# Patient Record
Sex: Male | Born: 1950
Health system: Southern US, Community
[De-identification: ages and names within clinical notes are randomized; demographics above are authoritative.]

## PROBLEM LIST (undated history)

## (undated) DIAGNOSIS — I10 Essential (primary) hypertension: Secondary | ICD-10-CM

## (undated) DIAGNOSIS — E7800 Pure hypercholesterolemia, unspecified: Secondary | ICD-10-CM

## (undated) DIAGNOSIS — C4491 Basal cell carcinoma of skin, unspecified: Secondary | ICD-10-CM

## (undated) DIAGNOSIS — L309 Dermatitis, unspecified: Secondary | ICD-10-CM

## (undated) HISTORY — DX: Pure hypercholesterolemia, unspecified: E78.00

## (undated) HISTORY — PX: VASECTOMY: SHX75

## (undated) HISTORY — DX: Essential (primary) hypertension: I10

## (undated) HISTORY — DX: Dermatitis, unspecified: L30.9

---
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
## 1996-07-10 HISTORY — PX: APPENDECTOMY: SHX54

## 2005-09-12 ENCOUNTER — Emergency Department (HOSPITAL_COMMUNITY): Admission: EM | Admit: 2005-09-12 | Discharge: 2005-09-12 | Payer: Self-pay | Admitting: Emergency Medicine

## 2007-05-17 ENCOUNTER — Emergency Department (HOSPITAL_COMMUNITY): Admission: EM | Admit: 2007-05-17 | Discharge: 2007-05-17 | Payer: Self-pay | Admitting: Emergency Medicine

## 2009-04-15 DIAGNOSIS — C4491 Basal cell carcinoma of skin, unspecified: Secondary | ICD-10-CM

## 2009-04-15 HISTORY — DX: Basal cell carcinoma of skin, unspecified: C44.91

## 2010-04-01 ENCOUNTER — Encounter: Admission: RE | Admit: 2010-04-01 | Discharge: 2010-04-01 | Payer: Self-pay | Admitting: Family Medicine

## 2010-08-23 ENCOUNTER — Encounter: Payer: Self-pay | Admitting: Internal Medicine

## 2010-08-26 ENCOUNTER — Encounter: Payer: Self-pay | Admitting: Internal Medicine

## 2010-08-29 ENCOUNTER — Encounter: Payer: Self-pay | Admitting: Internal Medicine

## 2010-09-13 ENCOUNTER — Encounter: Payer: Self-pay | Admitting: Internal Medicine

## 2010-09-13 DIAGNOSIS — R0989 Other specified symptoms and signs involving the circulatory and respiratory systems: Secondary | ICD-10-CM | POA: Insufficient documentation

## 2010-09-13 DIAGNOSIS — R0609 Other forms of dyspnea: Secondary | ICD-10-CM

## 2010-09-13 DIAGNOSIS — R072 Precordial pain: Secondary | ICD-10-CM | POA: Insufficient documentation

## 2010-09-13 DIAGNOSIS — R0602 Shortness of breath: Secondary | ICD-10-CM | POA: Insufficient documentation

## 2010-09-13 DIAGNOSIS — E7800 Pure hypercholesterolemia, unspecified: Secondary | ICD-10-CM | POA: Insufficient documentation

## 2010-09-14 ENCOUNTER — Institutional Professional Consult (permissible substitution) (INDEPENDENT_AMBULATORY_CARE_PROVIDER_SITE_OTHER): Payer: BC Managed Care – PPO | Admitting: Internal Medicine

## 2010-09-14 ENCOUNTER — Encounter: Payer: Self-pay | Admitting: Internal Medicine

## 2010-09-14 DIAGNOSIS — R0602 Shortness of breath: Secondary | ICD-10-CM

## 2010-09-15 ENCOUNTER — Encounter: Payer: Self-pay | Admitting: Internal Medicine

## 2010-09-15 ENCOUNTER — Telehealth (INDEPENDENT_AMBULATORY_CARE_PROVIDER_SITE_OTHER): Payer: Self-pay | Admitting: *Deleted

## 2010-09-15 ENCOUNTER — Ambulatory Visit (HOSPITAL_COMMUNITY)
Admission: RE | Admit: 2010-09-15 | Discharge: 2010-09-15 | Disposition: A | Discharge status: Home / Self Care | Payer: BC Managed Care – PPO | Source: Ambulatory Visit | Attending: Internal Medicine | Admitting: Internal Medicine

## 2010-09-15 DIAGNOSIS — R0602 Shortness of breath: Secondary | ICD-10-CM | POA: Insufficient documentation

## 2010-09-16 ENCOUNTER — Encounter: Payer: Self-pay | Admitting: Internal Medicine

## 2010-09-20 NOTE — Assessment & Plan Note (Signed)
Summary: dyspnea ///kp   Visit Type:  Initial Consult Copy to:  Dr. Jacinto Halim Primary Provider/Referring Provider:  none  CC:  Pulmonary OCnsult for SOB with exertion x 5 weeks. Marland Kitchen  History of Present Illness: March  78, 1474  60 year old male never smoker but passive smoker age 70 - 20. Previously well but wife feels past few months he has increasing tiredness more than usual. One month ago thought he had a cold (had congestion, flu like symptoms, shortness of breath, weak, exhaused but no fever). Went to work but came back home. 2 days later went to work but felt very tired, exhausted and feeling sick. Went to an urgent care - MD thought he had sinus and lung infection per hx. Got steroid and 5 days of an antibiotic but several days later still not better.  Went back to urgent care 08/22/2010 - CXR normal.  EKG was suspicious per hx and referred to Dr. Shon Baton. Could not do treadmill test due to dyspnea (taking showers was making him dyspneic). So had cardiolite (prior to test had collapsed and lost consciousness briefly) - this was normal. Later had ECHO - normal per hx. So, referred here to pulmonary  Currently overall better but feels dyspneic ("breathing not normal"). He feels he has to take a deep breath  to talk past few minutes. He notices this even while watching TV. Also, early am notices chest tightness and discomfort. Also, no energy for daily walks (tired at 200 yards of walking. Feels like he wants to lie down and sleep. Also, still with persistent dry cough that is no better (mucus has resloved though) and associated wheezing present. Laughing, talking, blowing nose makes him cough but not exertion.   note: Dr Shon Baton notes reviewd  Preventive Screening-Counseling & Management  Alcohol-Tobacco     Smoking Status: never     Passive Smoke Exposure: yes  Current Medications (verified): 1)  Aspirin 325 Mg Tabs (Aspirin) .... Take 1 Tablet By Mouth Once A Day 2)  Furosemide 40 Mg Tabs  (Furosemide) .... Take 1 Tablet By Mouth Once A Day 3)  Mucinex Maximum Strength 1200 Mg Xr12h-Tab (Guaifenesin) .... 2 Tablets Daily 4)  Proair Hfa 108 (90 Base) Mcg/act Aers (Albuterol Sulfate) .... 2 Puffs Four Times Daily As Needed 5)  Flonase 50 Mcg/act Susp (Fluticasone Propionate) .... 2 Sprays Daily 6)  Simvastatin 80 Mg Tabs (Simvastatin) .... Take 1 Tablet By Mouth Once A Day  Allergies (verified): 1)  Ampicillin  Past History:  Past Medical History: PURE HYPERCHOLESTEROLEMIA (ICD-272.0) diphtheria as a child  denies mi, coronary artery disease,      Social History: Marital Status: Married Children: 2 children Occupation:  Chartered certified accountant Patient never smoked.  Positive history of passive tobacco smoke exposure - age 2 - 80 (father, mother, uncle - all heavy smokers) Originally from [place] Wife is from [place] Smoking Status:  never Passive Smoke Exposure:  yes  Review of Systems       The patient complains of shortness of breath with activity, shortness of breath at rest, non-productive cough, headaches, and nasal congestion/difficulty breathing through nose.  The patient denies productive cough, coughing up blood, chest pain, irregular heartbeats, acid heartburn, indigestion, loss of appetite, weight change, abdominal pain, difficulty swallowing, sore throat, tooth/dental problems, sneezing, itching, ear ache, anxiety, depression, hand/feet swelling, joint stiffness or pain, rash, change in color of mucus, and fever.    Vital Signs:  Patient profile:   60 year old male  Height:      65 inches Weight:      176.50 pounds BMI:     29.48 O2 Sat:      96 % on Room air Temp:     97.9 degrees F oral Pulse rate:   105 / minute BP sitting:   130 / 88  (right arm) Cuff size:   regular  Vitals Entered By: Carron Curie CMA (September 14, 2010 9:22 AM)  O2 Flow:  Room air CC: Pulmonary OCnsult for SOB with exertion x 5 weeks.  Comments Medications reviewed with  patient Carron Curie CMA  September 14, 2010 9:24 AM Daytime phone number verified with patient. Medications reviewed with patient    Physical Exam  General:  well developed, well nourished, in no acute distress Head:  normocephalic and atraumatic Eyes:  PERRLA/EOM intact; conjunctiva and sclera clear Ears:  TMs intact and clear with normal canals Nose:  no deformity, discharge, inflammation, or lesions Mouth:  no deformity or lesions Neck:  no masses, thyromegaly, or abnormal cervical nodes Chest Wall:  no deformities noted Lungs:  clear bilaterally to auscultation and percussion Heart:  regular rate and rhythm, S1, S2 without murmurs, rubs, gallops, or clicks Abdomen:  bowel sounds positive; abdomen soft and non-tender without masses, or organomegaly Msk:  no deformity or scoliosis noted with normal posture Pulses:  pulses normal Extremities:  no clubbing, cyanosis, edema, or deformity noted Neurologic:  CN II-XII grossly intact with normal reflexes, coordination, muscle strength and tone Skin:  intact without lesions or rashes Cervical Nodes:  no significant adenopathy Axillary Nodes:  no significant adenopathy Psych:  alert and cooperative; normal mood and affect; normal attention span and concentration   CXR  Procedure date:  08/22/2010  Findings:      done at an outside urgent care: PA and LAT: looks clear to me  Impression & Recommendations:  Problem # 1:  SHORTNESS OF BREATH (SOB) (ICD-786.05) Assessment New suspect post viral asthenia, cough, weakness versys asthma (passive smoker as child)  plan full PFTs if normal, then will do methacholine challenge for asthma if this too is normal, will do cpst ( he needs all this done by end of month becuase insurance is running out) at fu will discuss checking alpha 1 (family hx of copd) Orders: Consultation Level V ([address])  Medications Added to Medication List This Visit: 1)  Aspirin 325 Mg Tabs (Aspirin) ....  Take 1 tablet by mouth once a day 2)  Furosemide 40 Mg Tabs (Furosemide) .... Take 1 tablet by mouth once a day 3)  Mucinex Maximum Strength 1200 Mg Xr12h-tab (Guaifenesin) .... 2 tablets daily 4)  Proair Hfa 108 (90 Base) Mcg/act Aers (Albuterol sulfate) .... 2 puffs four times daily as needed 5)  Flonase 50 Mcg/act Susp (Fluticasone propionate) .... 2 sprays daily 6)  Simvastatin 80 Mg Tabs (Simvastatin) .... Take 1 tablet by mouth once a day  Other Orders: Pulmonary Referral (Pulmonary)  Patient Instructions: 1)  [facility] or tomorrow 2)  call us as sooon as test is done so I can review it 3)  if this is normal you need provocation breathing test to look for asthma  4)  we will try to get all this done as speedily as possible  Appended Document: dyspnea ///kp Ambulatory Pulse Oximetry  Resting; HR__103___    02 Sat__93___  Lap1 (185 feet)   HR___104__   02 Sat__94___ Lap2 (185 feet)   HR___106__  02 Sat___95__    Lap3 (185 feet)   HR__110___   02 Sat___94__  __x_Test Completed without Difficulty ___Test Stopped due to:

## 2010-09-20 NOTE — Progress Notes (Signed)
Summary: work note  Phone Note Call from Patient Call back at [employer] [phone]   Caller: Patient Call For: Ramaswamy Summary of Call: pt called and wanted to know if he could get a doctor's note to be out of work until things get sorted out with his breathing.  Pt states he is unable to perform his normal work duties d/t the increased sob. And pt states there are no light duty jobs avail at work for him to do.  Please advise if pt can have work note or not. Marland KitchenArman Filter LPN  September 14, 2353 2:06 PM   Follow-up for Phone Call        Patient called back stated that he left a message yesterday afternoon and has not heard back. He can be reached at [phone].Vedia Coffer  September 16, 2010 2:20 PM  Additional Follow-up for Phone Call Additional follow up Details #1::        ok give work note for all of next week Additional Follow-up by: Kalman Shan MD,  September 16, 2010 2:24 PM    Additional Follow-up for Phone Call Additional follow up Details #2::    Called and spoke with pt and informed him about the work note putting him out for the next week. pt verbalized understanding and wants this mailed to his home. I verified address with pt and will mail to pt home. Carver Fila  September 16, 2010 2:31 PM

## 2010-09-20 NOTE — Progress Notes (Signed)
Summary: pft results  Phone Note Call from Patient Call back at Home Phone [phone]   Caller: Patient Call For: ramaswamy Summary of Call: pt had his pft  DONE THIS MORNING- CALLING PER MR'S INSTRUCTIONS. MR WAS GOING TO REVIEW AND CALL PT BACK Initial call taken by: Tivis Ringer, CNA,  September 15, 2010 11:43 AM  Follow-up for Phone Call        Per MR's instructions from yesterday, pt to call back as soon as he had PFTs.  He had them this am at [facility].  Will forward message to MR so he is aware. Gweneth Dimitri RN  September 15, 2010 11:54 AM   Additional Follow-up for Phone Call Additional follow up Details #1::        please have [facility] fax it to you and ples give it to me beofre I go to [facility] at 13/30 if possible. If not, leave on my desk Additional Follow-up by: Kalman Shan MD,  September 15, 2010 12:12 PM    Additional Follow-up for Phone Call Additional follow up Details #2::    Results faxed and given to MR.Michel Bickers [facility]  September 15, 2010 12:22 PM  Additional Follow-up for Phone Call Additional follow up Details #3:: Details for Additional Follow-up Action Taken: PFt normal. Pls set up fpor methacholine challenge test. He has to call just like this once it is done. He wants test asap due to inusrance reasons. Order done   called and spoke with pt.  pt aware of PFT results and that MR ordered for pt to have MCT results. Pt is aware to call the office once he has MCT.    Aundra Millet Reynolds LPN  September 14, 1476 2:04 PM

## 2010-09-20 NOTE — Letter (Signed)
Summary: Out of Work  [facility]  [address]   [place], [place] [address]   Phone: [phone]  Fax: [phone]    September 16, 2010   Employee:  Antonio Henry    To Whom It May Concern:   For Medical reasons, please excuse the above named employee from work for the following dates:  Start:   September 19, 2010  End:   September 25, 2010  If you need additional information, please feel free to contact our office.         Sincerely,       Kalman Shan MD

## 2010-09-21 ENCOUNTER — Telehealth (INDEPENDENT_AMBULATORY_CARE_PROVIDER_SITE_OTHER): Payer: Self-pay | Admitting: *Deleted

## 2010-09-21 ENCOUNTER — Institutional Professional Consult (permissible substitution): Payer: Self-pay | Admitting: Pulmonary Disease

## 2010-09-22 ENCOUNTER — Ambulatory Visit (HOSPITAL_COMMUNITY)
Admission: RE | Admit: 2010-09-22 | Discharge: 2010-09-22 | Disposition: A | Discharge status: Home / Self Care | Payer: BC Managed Care – PPO | Source: Ambulatory Visit | Attending: Internal Medicine | Admitting: Internal Medicine

## 2010-09-22 DIAGNOSIS — R0602 Shortness of breath: Secondary | ICD-10-CM | POA: Insufficient documentation

## 2010-09-22 LAB — PULMONARY FUNCTION TEST

## 2010-09-23 ENCOUNTER — Telehealth: Payer: Self-pay | Admitting: Internal Medicine

## 2010-09-23 ENCOUNTER — Encounter: Payer: Self-pay | Admitting: Internal Medicine

## 2010-09-26 ENCOUNTER — Encounter: Payer: Self-pay | Admitting: Internal Medicine

## 2010-09-27 ENCOUNTER — Encounter: Payer: Self-pay | Admitting: Internal Medicine

## 2010-09-27 ENCOUNTER — Ambulatory Visit (INDEPENDENT_AMBULATORY_CARE_PROVIDER_SITE_OTHER): Payer: BC Managed Care – PPO | Admitting: Internal Medicine

## 2010-09-27 ENCOUNTER — Other Ambulatory Visit (INDEPENDENT_AMBULATORY_CARE_PROVIDER_SITE_OTHER): Payer: BC Managed Care – PPO

## 2010-09-27 ENCOUNTER — Other Ambulatory Visit: Payer: Self-pay | Admitting: Internal Medicine

## 2010-09-27 DIAGNOSIS — R0602 Shortness of breath: Secondary | ICD-10-CM

## 2010-09-27 DIAGNOSIS — R5381 Other malaise: Secondary | ICD-10-CM

## 2010-09-27 DIAGNOSIS — R5383 Other fatigue: Secondary | ICD-10-CM

## 2010-09-27 DIAGNOSIS — E559 Vitamin D deficiency, unspecified: Secondary | ICD-10-CM | POA: Insufficient documentation

## 2010-09-27 LAB — TSH: TSH: 0.85 u[IU]/mL (ref 0.35–5.50)

## 2010-09-27 LAB — BASIC METABOLIC PANEL
BUN: 17 mg/dL (ref 6–23)
GFR: 40.27 mL/min — ABNORMAL LOW (ref >60.00)
Sodium: 143 mEq/L (ref 135–145)

## 2010-09-27 NOTE — Letter (Signed)
Summary: [facility]   Imported By: Lennie Odor 09/20/2010 11:56:49  _____________________________________________________________________  External Attachment:    Type:   Image     Comment:   External Document

## 2010-09-27 NOTE — Letter (Signed)
Summary: Out of Work  [facility]  [address]   [place], [place] [address]   Phone: [phone]  Fax: [phone]    September 23, 2010   Employee:  GERRIT RAFALSKI    To Whom It May Concern:   For Medical reasons, please excuse the above named employee from work for the following dates:  Start:   September 19, 2010  End:   October 02, 2010  If you need additional information, please feel free to contact our office.         Sincerely,    Dr. Kalman Shan

## 2010-09-27 NOTE — Letter (Signed)
Summary: [facility]  [facility]   Imported By: Lennie Odor 09/20/2010 11:57:20  _____________________________________________________________________  External Attachment:    Type:   Image     Comment:   External Document

## 2010-09-27 NOTE — Progress Notes (Signed)
Summary: test results  Phone Note Call from Patient Call back at Home Phone [phone]   Caller: Patient Call For: Soha Thorup Reason for Call: Lab or Test Results Summary of Call: Pt requests results of his methacholine test pls advise. Initial call taken by: Darletta Moll,  September 23, 2010 12:37 PM  Follow-up for Phone Call        Called and spoke with pt and he is wanting hsi results of his [facility] test and informed him MR was out of the office. pt states he needs to know what to do about work bc he is to return on Monday and pt states that MR was going to call him about his test results before them. Victorino Dike is going to get in touch with MR to see what he recs are. Will hold in triage until then [facility]  September 23, 2010 1:54 PM   MR states he will look over MCT today, will await for results. Carron Curie CMA  September 23, 2010 2:35 PM   MR reviewed test and states it is negative for asthma, and if pt is still SOB then will need CPST with EIB study.  I spoke with the pt and he states his SOB has improved, he states he is still having some fatigue. he does not want to have CPST since SOB is improved, however he wants a work note for next week, to return on 10-03-10 in order for him to get his strength back.  I have paged MR to ask if this was ok to do. Pt wants to pick up letter. Will await callback. Carron Curie CMA  September 23, 2010 2:56 PM  Per MR ok to give work note, set pt up for follow-up to discuss face to face results and plan. MR states ok to set for next week on a day with 4 pts.  Carron Curie CMA  September 23, 2010 3:06 PM   Additional Follow-up for Phone Call Additional follow up Details #1::        note placed at front and pt aware. appt set for 09-27-10. Carron Curie CMA  September 23, 2010 3:09 PM

## 2010-09-27 NOTE — Progress Notes (Signed)
Summary: No special instructions for methacholine challenge test  Phone Note Call from Patient Call back at Home Phone [phone]   Caller: Patient Call For: ramaswamy Summary of Call: patient is scheduled for testing tomorrow at [facility] and wants to know if there are any special intrustions. He can be reached at [phone] Initial call taken by: Vedia Coffer,  September 21, 2010 1:10 PM  Follow-up for Phone Call        called and spoke with pt and he is having a methacholine test tomorrow and wanted to know if he needed to do anything in particular. Per libby their are no restriction/ special instructions for this test. i advised pt of that and he verbalized understaning Carver Fila  September 21, 2010 2:45 PM

## 2010-09-27 NOTE — Assessment & Plan Note (Signed)
I think fatigue is also related to recent viral illness. He is getting better but to be on safe side will check tsh, vitamin d and bmet to ensure no other causes for fatigue. He is agreeable to this plan

## 2010-09-27 NOTE — Assessment & Plan Note (Signed)
Possible vitamin d def to account for symptoms. He avoids sun. Will check vitamin D levels

## 2010-09-27 NOTE — Patient Instructions (Addendum)
Your breathing test and challenge test for asthma is normal We have ruled out asthma and significant heart disease I think your symptoms are related to recent viral infection from which you are recovering To be safe let us check blood for thyroid, vitamin d and basic chemistry (per Dr. Lonia Blood notes basic chemistry was not done) I will call you with test results I plan to see you only if you are problems persist or worsen - call us if so

## 2010-09-27 NOTE — Progress Notes (Signed)
  Subjective:    Patient ID: Antonio Henry, male    DOB: 10/24/1950, 60 y.o.   MRN: [id number]  HPI Followup for dyspnea, and fatigue. Underwent PFTs 09/15/2010 and this was normal. Followed by methacholine challenge test on 09/22/2010 and this was normal too (based upon my review). In interim, continues to feel better. Stil with fatigue worsened by exertion and relieved by rest but much better. Associated dry cough + but this too is improving. Denies chest pain, edema, syncope, nausea, vomit, diarrhea    Review of Systems   Constitutional:   No  weight loss, night sweats,  Fevers, chills,, lassitude. HEENT:   No headaches,  Difficulty swallowing,  Tooth/dental problems,  Sore throat,                No sneezing, itching, ear ache, nasal congestion, post nasal drip,   CV:  No chest pain,  Orthopnea, PND, swelling in lower extremities, anasarca, dizziness, palpitations  GI  No heartburn, indigestion, abdominal pain, nausea, vomiting, diarrhea, change in bowel habits, loss of appetite  Resp:  No coughing up of blood.  No change in color of mucus.  No wheezing.  No chest wall deformity  Skin: no rash or lesions.  GU: no dysuria, change in color of urine, no urgency or frequency.  No flank pain.  MS:  No joint pain or swelling.  No decreased range of motion.  No back pain.  Psych:  No change in mood or affect. No depression or anxiety.  No memory loss.                      Objective:   Physical Exam  Nursing note and vitals reviewed. Constitutional: He is oriented to person, place, and time. He appears well-developed and well-nourished. No distress.  HENT:  Head: Normocephalic and atraumatic.  Right Ear: External ear normal.  Left Ear: External ear normal.  Mouth/Throat: Oropharynx is clear and moist. No oropharyngeal exudate.  Eyes: Conjunctivae and EOM are normal. Pupils are equal, round, and reactive to light. Right eye exhibits no discharge. Left eye exhibits no  discharge. No scleral icterus.  Neck: Normal range of motion. Neck supple. No JVD present. No tracheal deviation present. No thyromegaly present.  Cardiovascular: Normal rate, regular rhythm, normal heart sounds and intact distal pulses.  Exam reveals no gallop and no friction rub.   No murmur heard. Pulmonary/Chest: Effort normal and breath sounds normal. No respiratory distress. He has no wheezes. He has no rales. He exhibits no tenderness.  Abdominal: Soft. Bowel sounds are normal. He exhibits no distension and no mass. There is no tenderness. There is no rebound and no guarding.  Musculoskeletal: Normal range of motion. He exhibits no edema and no tenderness.  Lymphadenopathy:    He has no cervical adenopathy.  Neurological: He is alert and oriented to person, place, and time. He has normal reflexes. No cranial nerve deficit. Coordination normal.  Skin: Skin is warm and dry. No rash noted. He is not diaphoretic. No erythema. No pallor.  Psychiatric: He has a normal mood and affect. His behavior is normal. Judgment and thought content normal.          Assessment & Plan:

## 2010-09-27 NOTE — Assessment & Plan Note (Signed)
CAD, and asthma have been ruled out. PFTs is normal. He is getting better. I think this is post viral syndrome. I have reassured him that he is very likely to get better with time. If persists or does not resolve have asked him to call and visit with [place]

## 2010-09-28 ENCOUNTER — Telehealth: Payer: Self-pay | Admitting: Internal Medicine

## 2010-09-28 DIAGNOSIS — R5383 Other fatigue: Secondary | ICD-10-CM

## 2010-09-28 NOTE — Telephone Encounter (Signed)
Pleaes call patient. STate that TSH normal but chemistry shows creatinine (renal function)  - just above normal and is at 1.8mg %.  Bicarb also high at 30s. This suggests this is because of lasix. So, stop lasix. He is very dry. Needs recheck bmet in 1 week

## 2010-09-28 NOTE — Telephone Encounter (Signed)
Noted  

## 2010-09-30 NOTE — Telephone Encounter (Signed)
[email or web address].Mazi Schuff, CMA  

## 2010-10-04 ENCOUNTER — Emergency Department (HOSPITAL_COMMUNITY): Payer: BC Managed Care – PPO

## 2010-10-04 ENCOUNTER — Emergency Department (HOSPITAL_COMMUNITY)
Admission: EM | Admit: 2010-10-04 | Discharge: 2010-10-04 | Disposition: A | Discharge status: Home / Self Care | Payer: BC Managed Care – PPO | Attending: Emergency Medicine | Admitting: Emergency Medicine

## 2010-10-04 DIAGNOSIS — R079 Chest pain, unspecified: Secondary | ICD-10-CM | POA: Insufficient documentation

## 2010-10-04 DIAGNOSIS — R071 Chest pain on breathing: Secondary | ICD-10-CM | POA: Insufficient documentation

## 2010-10-04 NOTE — Telephone Encounter (Signed)
Pt aware of results. He stopped lasix on 09-27-10 so he wil lcome by tomorrow to have labs rechecked. Order placed.  Carron Curie, CMA

## 2010-10-05 ENCOUNTER — Other Ambulatory Visit (INDEPENDENT_AMBULATORY_CARE_PROVIDER_SITE_OTHER): Payer: BC Managed Care – PPO

## 2010-10-05 DIAGNOSIS — R5381 Other malaise: Secondary | ICD-10-CM

## 2010-10-05 DIAGNOSIS — R5383 Other fatigue: Secondary | ICD-10-CM

## 2010-10-05 LAB — BASIC METABOLIC PANEL
BUN: 15 mg/dL (ref 6–23)
Calcium: 9.1 mg/dL (ref 8.4–10.5)
Chloride: 103 mEq/L (ref 96–112)
GFR: 56.11 mL/min — ABNORMAL LOW (ref >60.00)
Glucose, Bld: 66 mg/dL — ABNORMAL LOW (ref 70–99)

## 2010-10-06 NOTE — Letter (Signed)
Summary: Out of Work  [facility]  [address]   [place], [place] [address]   Phone: [phone]  Fax: [phone]    September 27, 2010   Employee:  Antonio Henry    To Whom It May Concern:   For Medical reasons, please excuse the above named employee from work for the following dates:  Start:   September 19, 2010  End:   October 03, 2010  If you need additional information, please feel free to contact our office.         Sincerely,    Dr. Kalman Shan

## 2010-10-06 NOTE — Progress Notes (Signed)
Pt aware.

## 2010-10-12 ENCOUNTER — Telehealth: Payer: Self-pay | Admitting: Internal Medicine

## 2010-10-12 ENCOUNTER — Encounter: Payer: Self-pay | Admitting: Internal Medicine

## 2010-10-12 NOTE — Telephone Encounter (Signed)
I think it flowed into centricity. Creat dropped to 1.4mg % and is normal. Bicarb also normal. WOuld rec he  stay off lasix . Please send both bmet reports to Dr. Shon Baton. Please advise him to followup on this issue with whoever (? Dr Shon Baton) prescribe him lasix

## 2010-10-12 NOTE — Telephone Encounter (Signed)
Pt is aware of lab results and to stay off lasix and labs will be sent to Dr. Jacinto Halim.

## 2010-10-12 NOTE — Telephone Encounter (Signed)
Pt requesting lab results that was done on 3/28. Please advise Dr. Marchelle Gearing. Thanks  Carver Fila, CMA

## 2011-04-18 LAB — URINALYSIS, ROUTINE W REFLEX MICROSCOPIC
Bilirubin Urine: NEGATIVE
Glucose, UA: NEGATIVE
Hgb urine dipstick: NEGATIVE
Ketones, ur: NEGATIVE
Nitrite: NEGATIVE
Protein, ur: NEGATIVE
Specific Gravity, Urine: 1.021
Urobilinogen, UA: 0.2
pH: 6.5

## 2013-05-13 DIAGNOSIS — C4491 Basal cell carcinoma of skin, unspecified: Secondary | ICD-10-CM

## 2013-05-13 HISTORY — DX: Basal cell carcinoma of skin, unspecified: C44.91

## 2015-07-01 DIAGNOSIS — C4491 Basal cell carcinoma of skin, unspecified: Secondary | ICD-10-CM

## 2015-07-01 HISTORY — DX: Basal cell carcinoma of skin, unspecified: C44.91

## 2017-01-16 DIAGNOSIS — L309 Dermatitis, unspecified: Secondary | ICD-10-CM | POA: Diagnosis not present

## 2017-01-16 DIAGNOSIS — D229 Melanocytic nevi, unspecified: Secondary | ICD-10-CM | POA: Diagnosis not present

## 2017-01-16 DIAGNOSIS — L821 Other seborrheic keratosis: Secondary | ICD-10-CM | POA: Diagnosis not present

## 2017-01-16 DIAGNOSIS — C4441 Basal cell carcinoma of skin of scalp and neck: Secondary | ICD-10-CM | POA: Diagnosis not present

## 2017-01-16 DIAGNOSIS — C44319 Basal cell carcinoma of skin of other parts of face: Secondary | ICD-10-CM | POA: Diagnosis not present

## 2017-03-20 DIAGNOSIS — K210 Gastro-esophageal reflux disease with esophagitis: Secondary | ICD-10-CM | POA: Diagnosis not present

## 2017-03-20 DIAGNOSIS — K222 Esophageal obstruction: Secondary | ICD-10-CM | POA: Diagnosis not present

## 2017-03-20 DIAGNOSIS — Z1211 Encounter for screening for malignant neoplasm of colon: Secondary | ICD-10-CM | POA: Diagnosis not present

## 2017-04-02 DIAGNOSIS — C44319 Basal cell carcinoma of skin of other parts of face: Secondary | ICD-10-CM | POA: Diagnosis not present

## 2017-05-20 DIAGNOSIS — M25571 Pain in right ankle and joints of right foot: Secondary | ICD-10-CM | POA: Diagnosis not present

## 2017-06-12 DIAGNOSIS — D123 Benign neoplasm of transverse colon: Secondary | ICD-10-CM | POA: Diagnosis not present

## 2017-06-12 DIAGNOSIS — Z1211 Encounter for screening for malignant neoplasm of colon: Secondary | ICD-10-CM | POA: Diagnosis not present

## 2017-06-12 DIAGNOSIS — K5730 Diverticulosis of large intestine without perforation or abscess without bleeding: Secondary | ICD-10-CM | POA: Diagnosis not present

## 2018-07-22 DIAGNOSIS — M545 Low back pain: Secondary | ICD-10-CM | POA: Diagnosis not present

## 2018-07-22 DIAGNOSIS — M546 Pain in thoracic spine: Secondary | ICD-10-CM | POA: Diagnosis not present

## 2018-10-02 ENCOUNTER — Other Ambulatory Visit: Payer: Self-pay

## 2018-10-02 ENCOUNTER — Encounter (HOSPITAL_COMMUNITY): Payer: Self-pay | Admitting: Emergency Medicine

## 2018-10-02 ENCOUNTER — Emergency Department (HOSPITAL_COMMUNITY)
Admission: EM | Admit: 2018-10-02 | Discharge: 2018-10-02 | Disposition: A | Discharge status: Home / Self Care | Payer: PPO | Attending: Emergency Medicine | Admitting: Emergency Medicine

## 2018-10-02 ENCOUNTER — Emergency Department (HOSPITAL_COMMUNITY): Payer: PPO

## 2018-10-02 DIAGNOSIS — J069 Acute upper respiratory infection, unspecified: Secondary | ICD-10-CM

## 2018-10-02 DIAGNOSIS — Z7722 Contact with and (suspected) exposure to environmental tobacco smoke (acute) (chronic): Secondary | ICD-10-CM | POA: Insufficient documentation

## 2018-10-02 DIAGNOSIS — R000 Tachycardia, unspecified: Secondary | ICD-10-CM | POA: Diagnosis not present

## 2018-10-02 DIAGNOSIS — Z7982 Long term (current) use of aspirin: Secondary | ICD-10-CM | POA: Insufficient documentation

## 2018-10-02 DIAGNOSIS — Z79899 Other long term (current) drug therapy: Secondary | ICD-10-CM | POA: Insufficient documentation

## 2018-10-02 DIAGNOSIS — R0602 Shortness of breath: Secondary | ICD-10-CM | POA: Diagnosis not present

## 2018-10-02 DIAGNOSIS — R05 Cough: Secondary | ICD-10-CM | POA: Diagnosis not present

## 2018-10-02 LAB — D-DIMER, QUANTITATIVE: D-Dimer, Quant: 0.32 ug/mL-FEU (ref 0.00–0.50)

## 2018-10-02 LAB — CBC WITH DIFFERENTIAL/PLATELET
Abs Immature Granulocytes: 0.03 10*3/uL (ref 0.00–0.07)
BASOS ABS: 0.1 10*3/uL (ref 0.0–0.1)
Basophils Relative: 1 %
EOS PCT: 2 %
Eosinophils Absolute: 0.1 10*3/uL (ref 0.0–0.5)
HCT: 46.5 % (ref 39.0–52.0)
HEMOGLOBIN: 16.4 g/dL (ref 13.0–17.0)
Immature Granulocytes: 0 %
LYMPHS ABS: 1.2 10*3/uL (ref 0.7–4.0)
LYMPHS PCT: 16 %
MCH: 30.9 pg (ref 26.0–34.0)
MCHC: 35.3 g/dL (ref 30.0–36.0)
MCV: 87.7 fL (ref 80.0–100.0)
MONOS PCT: 8 %
Monocytes Absolute: 0.6 10*3/uL (ref 0.1–1.0)
NRBC: 0 % (ref 0.0–0.2)
Neutro Abs: 5.1 10*3/uL (ref 1.7–7.7)
Neutrophils Relative %: 73 %
Platelets: 198 10*3/uL (ref 150–400)
RBC: 5.3 MIL/uL (ref 4.22–5.81)
RDW: 12.9 % (ref 11.5–15.5)
WBC: 7.1 10*3/uL (ref 4.0–10.5)

## 2018-10-02 LAB — URINALYSIS, ROUTINE W REFLEX MICROSCOPIC
Bilirubin Urine: NEGATIVE
GLUCOSE, UA: NEGATIVE mg/dL
Hgb urine dipstick: NEGATIVE
Ketones, ur: NEGATIVE mg/dL
Leukocytes,Ua: NEGATIVE
Nitrite: NEGATIVE
PROTEIN: NEGATIVE mg/dL
Specific Gravity, Urine: 1.008 (ref 1.005–1.030)
pH: 7 (ref 5.0–8.0)

## 2018-10-02 LAB — COMPREHENSIVE METABOLIC PANEL
ALK PHOS: 78 U/L (ref 38–126)
ALT: 24 U/L (ref 0–44)
AST: 22 U/L (ref 15–41)
Albumin: 3.8 g/dL (ref 3.5–5.0)
Anion gap: 8 (ref 5–15)
BUN: 16 mg/dL (ref 8–23)
CHLORIDE: 106 mmol/L (ref 98–111)
CO2: 25 mmol/L (ref 22–32)
CREATININE: 1.23 mg/dL (ref 0.61–1.24)
Calcium: 9.4 mg/dL (ref 8.9–10.3)
GFR calc Af Amer: >60 mL/min (ref >60)
GFR calc non Af Amer: >60 mL/min (ref >60)
GLUCOSE: 123 mg/dL — AB (ref 70–99)
Potassium: 3.8 mmol/L (ref 3.5–5.1)
Sodium: 139 mmol/L (ref 135–145)
Total Bilirubin: 0.7 mg/dL (ref 0.3–1.2)
Total Protein: 6.9 g/dL (ref 6.5–8.1)

## 2018-10-02 LAB — BRAIN NATRIURETIC PEPTIDE: B NATRIURETIC PEPTIDE 5: 13 pg/mL (ref 0.0–100.0)

## 2018-10-02 NOTE — ED Triage Notes (Signed)
Pt arrives to ED from home with complaints of a dry cough that started yesterday, shortness of breath that started this morning, and lethergy.  Pt reports he traveled to [place] and [facility] March 11th - March 22nd. Pt left from [place] and came back in [place]. Pt traveled with his wife.

## 2018-10-02 NOTE — ED Provider Notes (Signed)
[facility] EMERGENCY DEPARTMENT Provider Note   CSN: [id number] Arrival date & time: 10/02/18  4193    History   Chief Complaint Chief Complaint  Patient presents with  . Cough  . Shortness of Breath    HPI Antonio Henry is a 68 y.o. male who presents with cc of URI sxs and sob. Patient traveled to [place] about 2 weeks ago. Yesterday he developed sxs of URI including dry cough, watery eyes, and today he is feeling short of breath and fatigued. He denies fever, myalgias, wheezing.  He denies a history of asthma.  He he states that he feels like he just "cannot get a full breath."  He denies hemoptysis, unilateral leg swelling, history of DVT, PE, cancer.  Risk factor for pulmonary embolus includes recent overseas flight.  He has no known contact with lab confirmed positive novel coronavirus patient.    HPI  Past Medical History:  Diagnosis Date  . Diphtheria    as a child  . Pure hypercholesterolemia     Patient Active Problem List   Diagnosis Date Noted  . Fatigue 09/27/2010  . Vitamin D deficiency 09/27/2010  . PURE HYPERCHOLESTEROLEMIA 09/13/2010  . Shortness of breath 09/13/2010  . OTHER DYSPNEA AND RESPIRATORY ABNORMALITIES 09/13/2010  . PRECORDIAL PAIN 09/13/2010    Past Surgical History:  Procedure Laterality Date  . APPENDECTOMY  1998        Home Medications    Prior to Admission medications   Medication Sig Start Date End Date Taking? Authorizing Provider  albuterol (PROAIR HFA) 108 (90 BASE) MCG/ACT inhaler Inhale 2 puffs into the lungs every 6 (six) hours as needed.      [provider]  aspirin 325 MG tablet Take 325 mg by mouth daily.      [provider]  fluticasone (FLONASE) 50 MCG/ACT nasal spray 2 sprays by Nasal route daily.      [provider]  furosemide (LASIX) 40 MG tablet Take 40 mg by mouth daily.      [provider]  Pseudoephedrine-Guaifenesin (MUCINEX D) [phone] MG TB12 Take 2  tablets by mouth daily.      [provider]  simvastatin (ZOCOR) 80 MG tablet Take 80 mg by mouth at bedtime.      [provider]    Family History Family History  Problem Relation Age of Onset  . Heart failure Father 71  . Stomach cancer Mother 38  . COPD Unknown        3 siblings    Social History Social History   Tobacco Use  . Smoking status: Passive Smoke Exposure - Never Smoker  Substance Use Topics  . Alcohol use: Not on file  . Drug use: Not on file     Allergies   Ampicillin   Review of Systems Review of Systems Ten systems reviewed and are negative for acute change, except as noted in the HPI.   Physical Exam Updated Vital Signs BP 134/82 (BP Location: Right Arm)   Pulse 100   Temp 97.9 F (36.6 C) (Oral)   Resp 17   Ht 5\' 5"  (1.651 m)   Wt 72.6 kg   SpO2 99%   BMI 26.63 kg/m   Physical Exam Vitals signs and nursing note reviewed.  Constitutional:      General: He is not in acute distress.    Appearance: He is well-developed. He is not diaphoretic.  HENT:     Head: Normocephalic and  atraumatic.  Eyes:     General: No scleral icterus.    Conjunctiva/sclera: Conjunctivae normal.  Neck:     Musculoskeletal: Normal range of motion and neck supple.  Cardiovascular:     Rate and Rhythm: Normal rate and regular rhythm.     Heart sounds: Normal heart sounds.  Pulmonary:     Effort: Pulmonary effort is normal. No respiratory distress.     Breath sounds: Normal breath sounds.  Abdominal:     Palpations: Abdomen is soft.     Tenderness: There is no abdominal tenderness.  Skin:    General: Skin is warm and dry.  Neurological:     Mental Status: He is alert.  Psychiatric:        Behavior: Behavior normal.       ED Treatments / Results  Labs (all labs ordered are listed, but only abnormal results are displayed) Labs Reviewed - No data to display  EKG EKG Interpretation  Date/Time:  Wednesday October 02 2018 10:08:16 EDT  Ventricular Rate:  100 PR Interval:    QRS Duration: 157 QT Interval:  372 QTC Calculation: 480 R Axis:   50 Text Interpretation:  Sinus tachycardia Right bundle branch block RBBB present on prior EKG 09/12/05 Confirmed by Quintella Reichert [phone]) on 10/02/2018 10:14:38 AM   Radiology Dg Chest Port 1 View  Result Date: 10/02/2018 CLINICAL DATA:  Cough and shortness of breath EXAM: PORTABLE CHEST 1 VIEW COMPARISON:  October 04, 2010. FINDINGS: There is mild bibasilar atelectasis. There is no edema consolidation. Heart size and pulmonary vascularity are normal. No adenopathy. No bone lesions. IMPRESSION: Bibasilar atelectasis. No edema or consolidation. Stable cardiac silhouette. Electronically Signed   By: Lowella Grip III M.D.   On: 10/02/2018 11:16    Procedures Procedures (including critical care time)  Medications Ordered in ED Medications - No data to display   Initial Impression / Assessment and Plan / ED Course  I have reviewed the triage vital signs and the nursing notes.  Pertinent labs & imaging results that were available during my care of the patient were reviewed by me and considered in my medical decision making (see chart for details).       CC:URI VS: BP 130/84 (BP Location: Left Arm)   Pulse 76   Temp 97.9 F (36.6 C) (Oral)   Resp (!) 21   Ht 5\' 5"  (1.651 m)   Wt 72.6 kg   SpO2 96%   BMI 26.63 kg/m  HB:ZJIRCVE is gathered by Patient  and EMR. DDX: Differential diagnosis for emergent cause of cough includes but is not limited to upper respiratory infection, lower respiratory infection, allergies, asthma, irritants, foreign body, medications such as ACE inhibitors, reflux, asthma, CHF, lung cancer, interstitial lung disease, psychiatric causes, postnasal drip and postinfectious bronchospasm. Labs: I reviewed the labs which show elevated glucose.  Urine is negative.  D-dimer negative.  No leukocytosis.   Imaging: I personally reviewed the images (PA and  lateral chest film) which show(s) no evidence of consolidation or other infection EKG: Normal sinus rhythm with right bundle branch block.  Unchanged from previous tracing MDM: Pt CXR negative for acute infiltrate. Patients symptoms are consistent with URI, likely viral etiology. Discussed that antibiotics are not indicated for viral infections. Pt will be discharged with symptomatic treatment.  Patient will be discharged with self-isolation instructions to include isolation until 3 days fever/ free without antipyretics or 7 days from onset of symptoms with resolution. Patient disposition: Home discharge Patient  condition: Excellent. The patient appears reasonably screened and/or stabilized for discharge and I doubt any other medical condition or other [facility] requiring further screening, evaluation, or treatment in the ED at this time prior to discharge. I have discussed lab and/or imaging findings with the patient and answered all questions/concerns to the best of my ability. I have discussed return precautions and OP follow up.      Final Clinical Impressions(s) / ED Diagnoses   Final diagnoses:  None    ED Discharge Orders    None       Margarita Mail, PA-C 10/02/18 1455    Quintella Reichert, MD 10/03/18 [phone]

## 2018-10-02 NOTE — ED Notes (Signed)
Patient verbalizes understanding of discharge instructions. Opportunity for questioning and answers were provided. Armband removed by staff, pt discharged from ED.  

## 2018-10-02 NOTE — Discharge Instructions (Addendum)
You seem to have an unknown respiratory infection Per the current CDC/ [place] DHHS guidelines you do not meet the current standard for testing. You will need to voluntarily [place] at home for the following: 1) Self Quarantine for at least 3 days after fever has resolved wihout the use of fever reducing medications 2) At least 7 days after the onset of your symptoms (10/01/2018- 10/08/2018).  Below are the quarantine guidelines from the [facility]     Person Under Monitoring Name: Antonio Henry  Location: [place] [address]   Infection Prevention Recommendations for Individuals Confirmed to have, or Being Evaluated for, 2019 Novel Coronavirus (COVID-19) Infection Who Receive Care at Home  Individuals who are confirmed to have, or are being evaluated for, COVID-19 should follow the prevention steps below until a healthcare provider or local or state health department says they can return to normal activities.  Stay home except to get medical care You should restrict activities outside your home, except for getting medical care. Do not go to work, school, or public areas, and do not use public transportation or taxis.  Call ahead before visiting your doctor Before your medical appointment, call the healthcare provider and tell them that you have, or are being evaluated for, COVID-19 infection. This will help the healthcare providers office take steps to keep other people from getting infected. Ask your healthcare provider to call the local or state health department.  Monitor your symptoms Seek prompt medical attention if your illness is worsening (e.g., difficulty breathing). Before going to your medical appointment, call the healthcare provider and tell them that you have, or are being evaluated for, COVID-19 infection. Ask your healthcare provider to call the local or state health department.  Wear a facemask You should wear a facemask that covers your nose and mouth when  you are in the same room with other people and when you visit a healthcare provider. People who live with or visit you should also wear a facemask while they are in the same room with you.  Separate yourself from other people in your home As much as possible, you should stay in a different room from other people in your home. Also, you should use a separate bathroom, if available.  Avoid sharing household items You should not share dishes, drinking glasses, cups, eating utensils, towels, bedding, or other items with other people in your home. After using these items, you should wash them thoroughly with soap and water.  Cover your coughs and sneezes Cover your mouth and nose with a tissue when you cough or sneeze, or you can cough or sneeze into your sleeve. Throw used tissues in a lined trash can, and immediately wash your hands with soap and water for at least 20 seconds or use an alcohol-based hand rub.  Wash your [facility] your hands often and thoroughly with soap and water for at least 20 seconds. You can use an alcohol-based hand sanitizer if soap and water are not available and if your hands are not visibly dirty. Avoid touching your eyes, nose, and mouth with unwashed hands.   Prevention Steps for Caregivers and Household Members of Individuals Confirmed to have, or Being Evaluated for, COVID-19 Infection Being Cared for in the Home  If you live with, or provide care at home for, a person confirmed to have, or being evaluated for, COVID-19 infection please follow these guidelines to prevent infection:  Follow healthcare providers instructions Make sure that you understand  and can help the patient follow any healthcare provider instructions for all care.  Provide for the patients basic needs You should help the patient with basic needs in the home and provide support for getting groceries, prescriptions, and other personal needs.  Monitor the patients symptoms If they  are getting sicker, call his or her medical provider and tell them that the patient has, or is being evaluated for, COVID-19 infection. This will help the healthcare providers office take steps to keep other people from getting infected. Ask the healthcare provider to call the local or state health department.  Limit the number of people who have contact with the patient If possible, have only one caregiver for the patient. Other household members should stay in another home or place of residence. If this is not possible, they should stay in another room, or be separated from the patient as much as possible. Use a separate bathroom, if available. Restrict visitors who do not have an essential need to be in the home.  Keep older adults, very young children, and other sick people away from the patient Keep older adults, very young children, and those who have compromised immune systems or chronic health conditions away from the patient. This includes people with chronic heart, lung, or kidney conditions, diabetes, and cancer.  Ensure good ventilation Make sure that shared spaces in the home have good air flow, such as from an air conditioner or an opened window, weather permitting.  Wash your hands often Wash your hands often and thoroughly with soap and water for at least 20 seconds. You can use an alcohol based hand sanitizer if soap and water are not available and if your hands are not visibly dirty. Avoid touching your eyes, nose, and mouth with unwashed hands. Use disposable paper towels to dry your hands. If not available, use dedicated cloth towels and replace them when they become wet.  Wear a facemask and gloves Wear a disposable facemask at all times in the room and gloves when you touch or have contact with the patients blood, body fluids, and/or secretions or excretions, such as sweat, saliva, sputum, nasal mucus, vomit, urine, or feces.  Ensure the mask fits over your nose and  mouth tightly, and do not touch it during use. Throw out disposable facemasks and gloves after using them. Do not reuse. Wash your hands immediately after removing your facemask and gloves. If your personal clothing becomes contaminated, carefully remove clothing and launder. Wash your hands after handling contaminated clothing. Place all used disposable facemasks, gloves, and other waste in a lined container before disposing them with other household waste. Remove gloves and wash your hands immediately after handling these items.  Do not share dishes, glasses, or other household items with the patient Avoid sharing household items. You should not share dishes, drinking glasses, cups, eating utensils, towels, bedding, or other items with a patient who is confirmed to have, or being evaluated for, COVID-19 infection. After the person uses these items, you should wash them thoroughly with soap and water.  Wash laundry thoroughly Immediately remove and wash clothes or bedding that have blood, body fluids, and/or secretions or excretions, such as sweat, saliva, sputum, nasal mucus, vomit, urine, or feces, on them. Wear gloves when handling laundry from the patient. Read and follow directions on labels of laundry or clothing items and detergent. In general, wash and dry with the warmest temperatures recommended on the label.  Clean all areas the individual has used often Clean  all touchable surfaces, such as counters, tabletops, doorknobs, bathroom fixtures, toilets, phones, keyboards, tablets, and bedside tables, every day. Also, clean any surfaces that may have blood, body fluids, and/or secretions or excretions on them. Wear gloves when cleaning surfaces the patient has come in contact with. Use a diluted bleach solution (e.g., dilute bleach with 1 part bleach and 10 parts water) or a household disinfectant with a label that says EPA-registered for coronaviruses. To make a bleach solution at home,  add 1 tablespoon of bleach to 1 quart (4 cups) of water. For a larger supply, add  cup of bleach to 1 gallon (16 cups) of water. Read labels of cleaning products and follow recommendations provided on product labels. Labels contain instructions for safe and effective use of the cleaning product including precautions you should take when applying the product, such as wearing gloves or eye protection and making sure you have good ventilation during use of the product. Remove gloves and wash hands immediately after cleaning.  Monitor yourself for signs and symptoms of illness Caregivers and household members are considered close contacts, should monitor their health, and will be asked to limit movement outside of the home to the extent possible. Follow the monitoring steps for close contacts listed on the symptom monitoring form.   ? If you have additional questions, contact your local health department or call the epidemiologist on call at [phone] (available 24/7). ? This guidance is subject to change. For the most up-to-date guidance from [employer], please refer to their website: YouBlogs.pl

## 2018-10-02 NOTE — Patient Outreach (Signed)
[facility]) Care Management  10/02/2018  Antonio Henry [birth year] [id number]   Referral Date: 10/02/2018 Referral Source: Nurseline Referral Reason: Returned recently from the [place], has dry cough and shortness of breath   Outreach Attempt: telephone call to patient.  No answer.  HIPAA compliant voice message left.  Patient did go to ED as ordered as seen in medical record.     Plan: RN CM will attempt patient again within 4 business days and send letter.   Jone Baseman, RN, MSN [facility] Care Management Care Management Coordinator Direct Line [phone] Toll Free: [phone]  Fax: [phone]

## 2018-10-03 ENCOUNTER — Other Ambulatory Visit: Payer: Self-pay

## 2018-10-03 ENCOUNTER — Ambulatory Visit: Payer: Self-pay

## 2018-10-03 NOTE — Patient Outreach (Signed)
[facility]) Care Management  10/03/2018  Antonio Henry 02-27-1951 [id number]   Referral Date: 10/02/2018 Referral Source: Nurseline Referral Reason: Returned recently from the [place], has dry cough and shortness of breath   Outreach Attempt: spoke with patient. He states he feels much better today,  He states that his temperature today was 97.  He is very complimentary of how he was treated at the ED.  Discussed with patient virus treatment versus bacterial infection treatment.  He verbalized understanding.  Also discussed sign of worsening and seeking treatment.  He verbalized understanding.  Patient voices no further questions, concerns, or needs at this time.    Plan: RN CM will close case.    Jone Baseman, RN, MSN [place] Management Care Management Coordinator Direct Line [phone] Cell [phone] Toll Free: [phone]  Fax: [phone]

## 2018-12-25 DIAGNOSIS — K219 Gastro-esophageal reflux disease without esophagitis: Secondary | ICD-10-CM | POA: Diagnosis not present

## 2018-12-25 DIAGNOSIS — Z1159 Encounter for screening for other viral diseases: Secondary | ICD-10-CM | POA: Diagnosis not present

## 2018-12-25 DIAGNOSIS — E7800 Pure hypercholesterolemia, unspecified: Secondary | ICD-10-CM | POA: Diagnosis not present

## 2018-12-25 DIAGNOSIS — Q649 Congenital malformation of urinary system, unspecified: Secondary | ICD-10-CM | POA: Diagnosis not present

## 2018-12-25 DIAGNOSIS — I10 Essential (primary) hypertension: Secondary | ICD-10-CM | POA: Diagnosis not present

## 2018-12-25 DIAGNOSIS — D509 Iron deficiency anemia, unspecified: Secondary | ICD-10-CM | POA: Diagnosis not present

## 2019-04-03 DIAGNOSIS — R972 Elevated prostate specific antigen [PSA]: Secondary | ICD-10-CM | POA: Diagnosis not present

## 2019-04-03 DIAGNOSIS — E7800 Pure hypercholesterolemia, unspecified: Secondary | ICD-10-CM | POA: Diagnosis not present

## 2019-04-30 ENCOUNTER — Other Ambulatory Visit (HOSPITAL_BASED_OUTPATIENT_CLINIC_OR_DEPARTMENT_OTHER): Payer: Self-pay | Admitting: Nurse Practitioner

## 2019-04-30 ENCOUNTER — Ambulatory Visit (HOSPITAL_BASED_OUTPATIENT_CLINIC_OR_DEPARTMENT_OTHER)
Admission: RE | Admit: 2019-04-30 | Discharge: 2019-04-30 | Disposition: A | Discharge status: Home / Self Care | Payer: PPO | Source: Ambulatory Visit | Attending: Nurse Practitioner | Admitting: Nurse Practitioner

## 2019-04-30 ENCOUNTER — Other Ambulatory Visit: Payer: Self-pay

## 2019-04-30 DIAGNOSIS — M25561 Pain in right knee: Secondary | ICD-10-CM

## 2019-04-30 DIAGNOSIS — M1711 Unilateral primary osteoarthritis, right knee: Secondary | ICD-10-CM | POA: Diagnosis not present

## 2019-04-30 DIAGNOSIS — G43909 Migraine, unspecified, not intractable, without status migrainosus: Secondary | ICD-10-CM | POA: Diagnosis not present

## 2019-04-30 DIAGNOSIS — J302 Other seasonal allergic rhinitis: Secondary | ICD-10-CM | POA: Diagnosis not present

## 2019-05-02 DIAGNOSIS — R799 Abnormal finding of blood chemistry, unspecified: Secondary | ICD-10-CM | POA: Diagnosis not present

## 2019-05-02 DIAGNOSIS — R7301 Impaired fasting glucose: Secondary | ICD-10-CM | POA: Diagnosis not present

## 2019-05-26 DIAGNOSIS — K5732 Diverticulitis of large intestine without perforation or abscess without bleeding: Secondary | ICD-10-CM | POA: Diagnosis not present

## 2019-05-26 DIAGNOSIS — R1032 Left lower quadrant pain: Secondary | ICD-10-CM | POA: Diagnosis not present

## 2019-06-23 DIAGNOSIS — I781 Nevus, non-neoplastic: Secondary | ICD-10-CM | POA: Diagnosis not present

## 2019-06-23 DIAGNOSIS — C44319 Basal cell carcinoma of skin of other parts of face: Secondary | ICD-10-CM | POA: Diagnosis not present

## 2019-06-23 DIAGNOSIS — C4481 Basal cell carcinoma of overlapping sites of skin: Secondary | ICD-10-CM | POA: Diagnosis not present

## 2019-06-23 DIAGNOSIS — L929 Granulomatous disorder of the skin and subcutaneous tissue, unspecified: Secondary | ICD-10-CM | POA: Diagnosis not present

## 2019-06-23 DIAGNOSIS — D485 Neoplasm of uncertain behavior of skin: Secondary | ICD-10-CM | POA: Diagnosis not present

## 2019-06-23 DIAGNOSIS — L570 Actinic keratosis: Secondary | ICD-10-CM | POA: Diagnosis not present

## 2019-06-23 DIAGNOSIS — C44619 Basal cell carcinoma of skin of left upper limb, including shoulder: Secondary | ICD-10-CM | POA: Diagnosis not present

## 2019-06-30 DIAGNOSIS — E7800 Pure hypercholesterolemia, unspecified: Secondary | ICD-10-CM | POA: Diagnosis not present

## 2019-06-30 DIAGNOSIS — I10 Essential (primary) hypertension: Secondary | ICD-10-CM | POA: Diagnosis not present

## 2019-06-30 DIAGNOSIS — G43909 Migraine, unspecified, not intractable, without status migrainosus: Secondary | ICD-10-CM | POA: Diagnosis not present

## 2019-07-21 DIAGNOSIS — C44519 Basal cell carcinoma of skin of other part of trunk: Secondary | ICD-10-CM | POA: Diagnosis not present

## 2019-09-30 DIAGNOSIS — M545 Low back pain: Secondary | ICD-10-CM | POA: Diagnosis not present

## 2019-10-15 DIAGNOSIS — B079 Viral wart, unspecified: Secondary | ICD-10-CM | POA: Diagnosis not present

## 2019-10-15 DIAGNOSIS — D485 Neoplasm of uncertain behavior of skin: Secondary | ICD-10-CM | POA: Diagnosis not present

## 2019-10-15 DIAGNOSIS — C44319 Basal cell carcinoma of skin of other parts of face: Secondary | ICD-10-CM | POA: Diagnosis not present

## 2019-11-12 DIAGNOSIS — Z20822 Contact with and (suspected) exposure to covid-19: Secondary | ICD-10-CM | POA: Diagnosis not present

## 2019-11-12 DIAGNOSIS — Z20828 Contact with and (suspected) exposure to other viral communicable diseases: Secondary | ICD-10-CM | POA: Diagnosis not present

## 2019-11-12 DIAGNOSIS — Z03818 Encounter for observation for suspected exposure to other biological agents ruled out: Secondary | ICD-10-CM | POA: Diagnosis not present

## 2019-11-19 ENCOUNTER — Encounter: Payer: Self-pay | Admitting: *Deleted

## 2020-02-02 DIAGNOSIS — L0390 Cellulitis, unspecified: Secondary | ICD-10-CM | POA: Diagnosis not present

## 2020-03-31 DIAGNOSIS — M25561 Pain in right knee: Secondary | ICD-10-CM | POA: Diagnosis not present

## 2020-03-31 DIAGNOSIS — M542 Cervicalgia: Secondary | ICD-10-CM | POA: Diagnosis not present

## 2020-03-31 DIAGNOSIS — M25562 Pain in left knee: Secondary | ICD-10-CM | POA: Diagnosis not present

## 2020-05-17 IMAGING — DX DG KNEE 1-2V*R*
2 series · 2 of 2 positions shown · non-contrast
Comparison: None

CLINICAL DATA: Pain with rotation for 2 weeks. No acute recent
injury.

EXAM:
RIGHT KNEE - 1-2 VIEW

[knee ap]
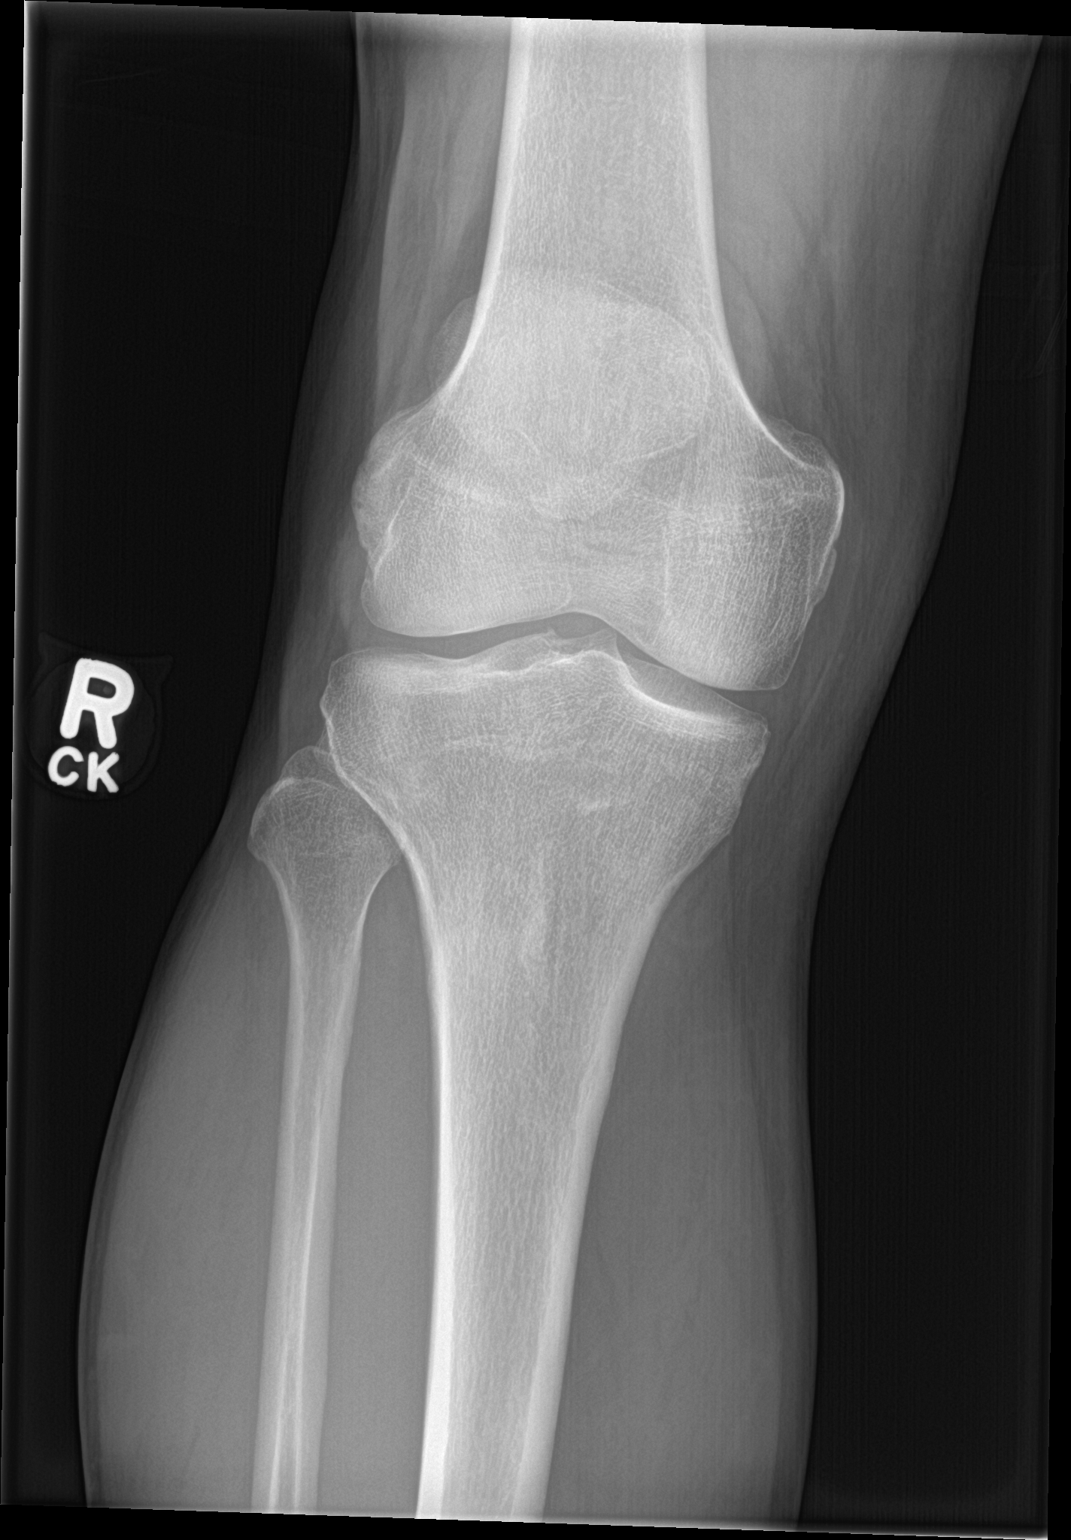

[knee lat]
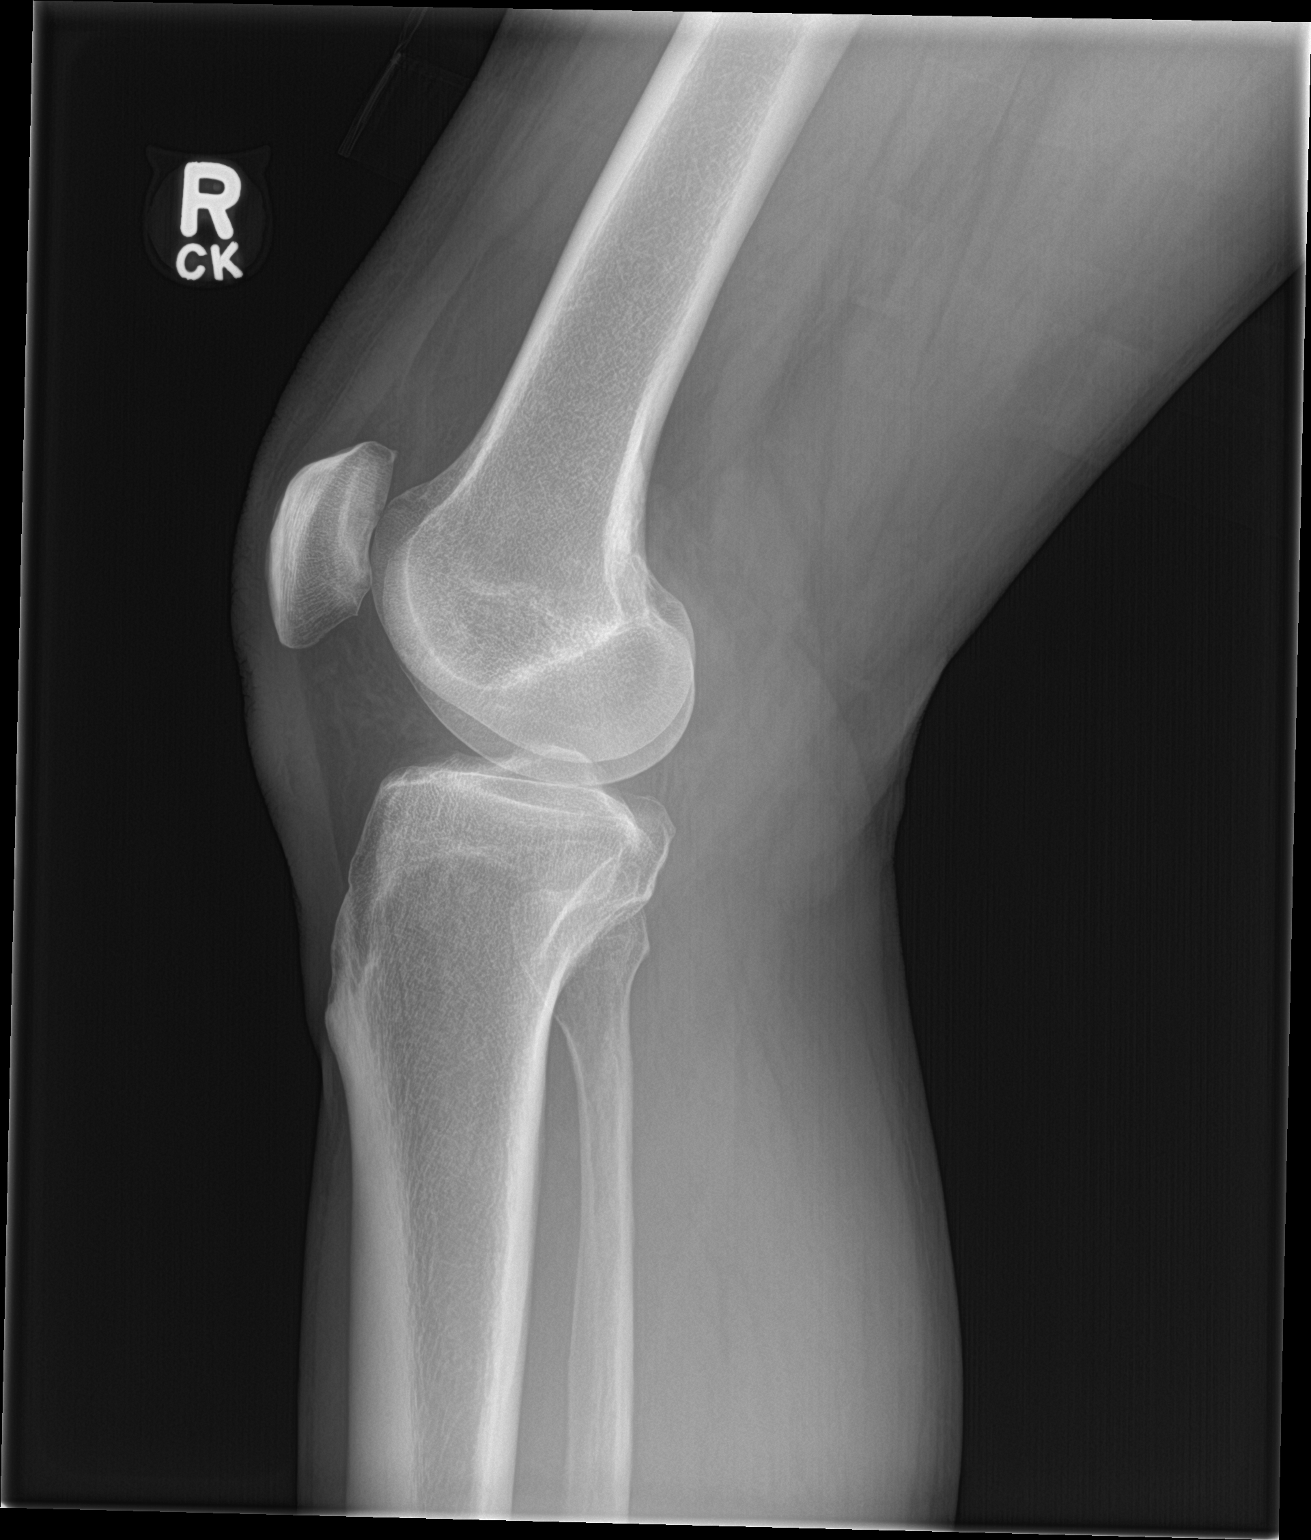

[2 of 2 positions shown; findings below may reference images not displayed]

FINDINGS: AP and lateral views. Mild patellofemoral compartment joint space
narrowing and subchondral sclerosis involving the patella. No acute
fracture or dislocation. No joint effusion.
IMPRESSION: Mild patellofemoral compartment osteoarthritis.  No acute findings.
